# Patient Record
Sex: Male | Born: 2000 | Race: Black or African American | Hispanic: No | Marital: Single | Smoking: Never smoker
Health system: Southern US, Community
[De-identification: ages and names within clinical notes are randomized; demographics above are authoritative.]

## PROBLEM LIST (undated history)

## (undated) DIAGNOSIS — R0789 Other chest pain: Secondary | ICD-10-CM

---
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
## 2019-03-28 IMAGING — CR DG CHEST 2V
2 series · 2 of 2 positions shown · non-contrast
Comparison: None.

CLINICAL DATA: Intermittent cramping

EXAM:
CHEST  2 VIEW

[chest pa]
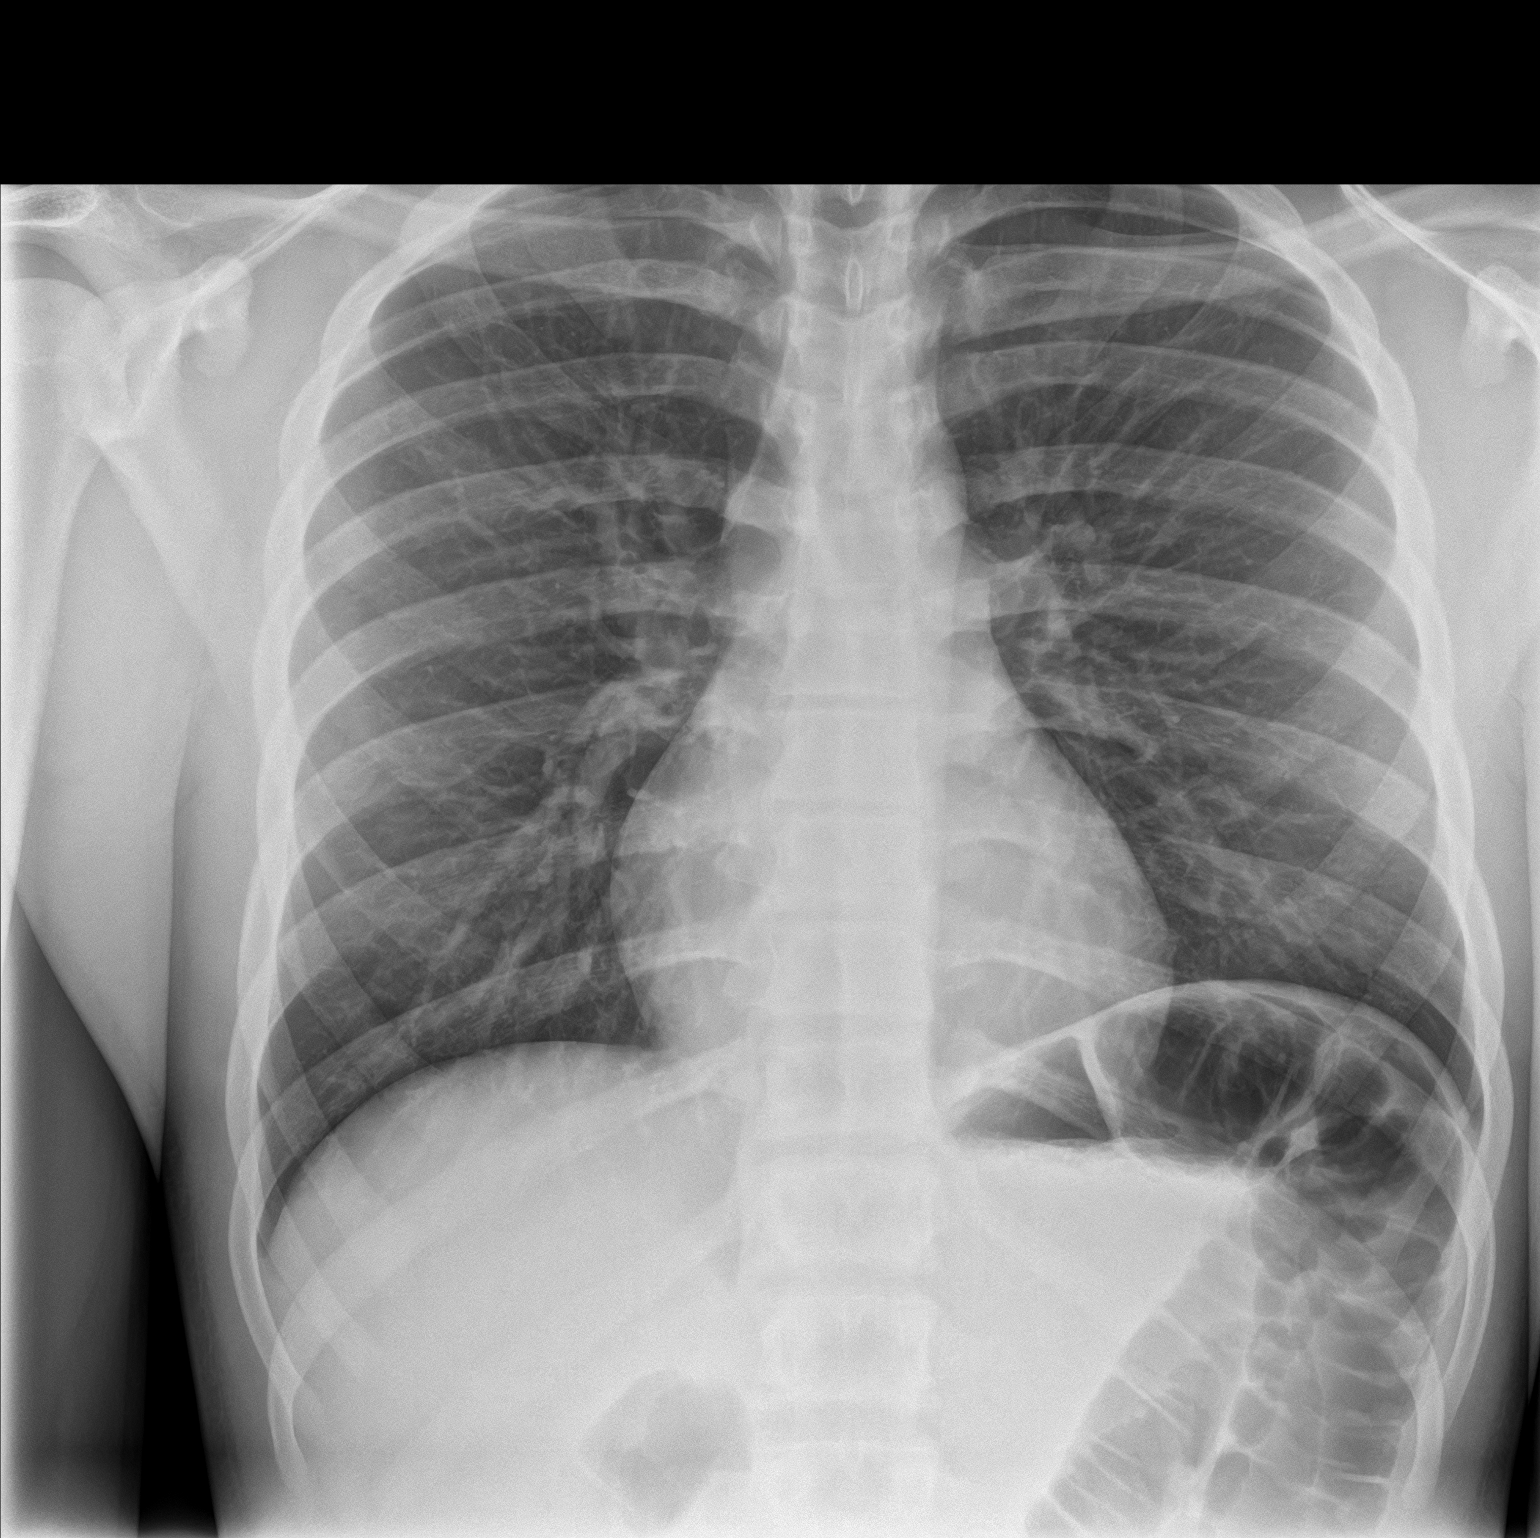

[chest lat]
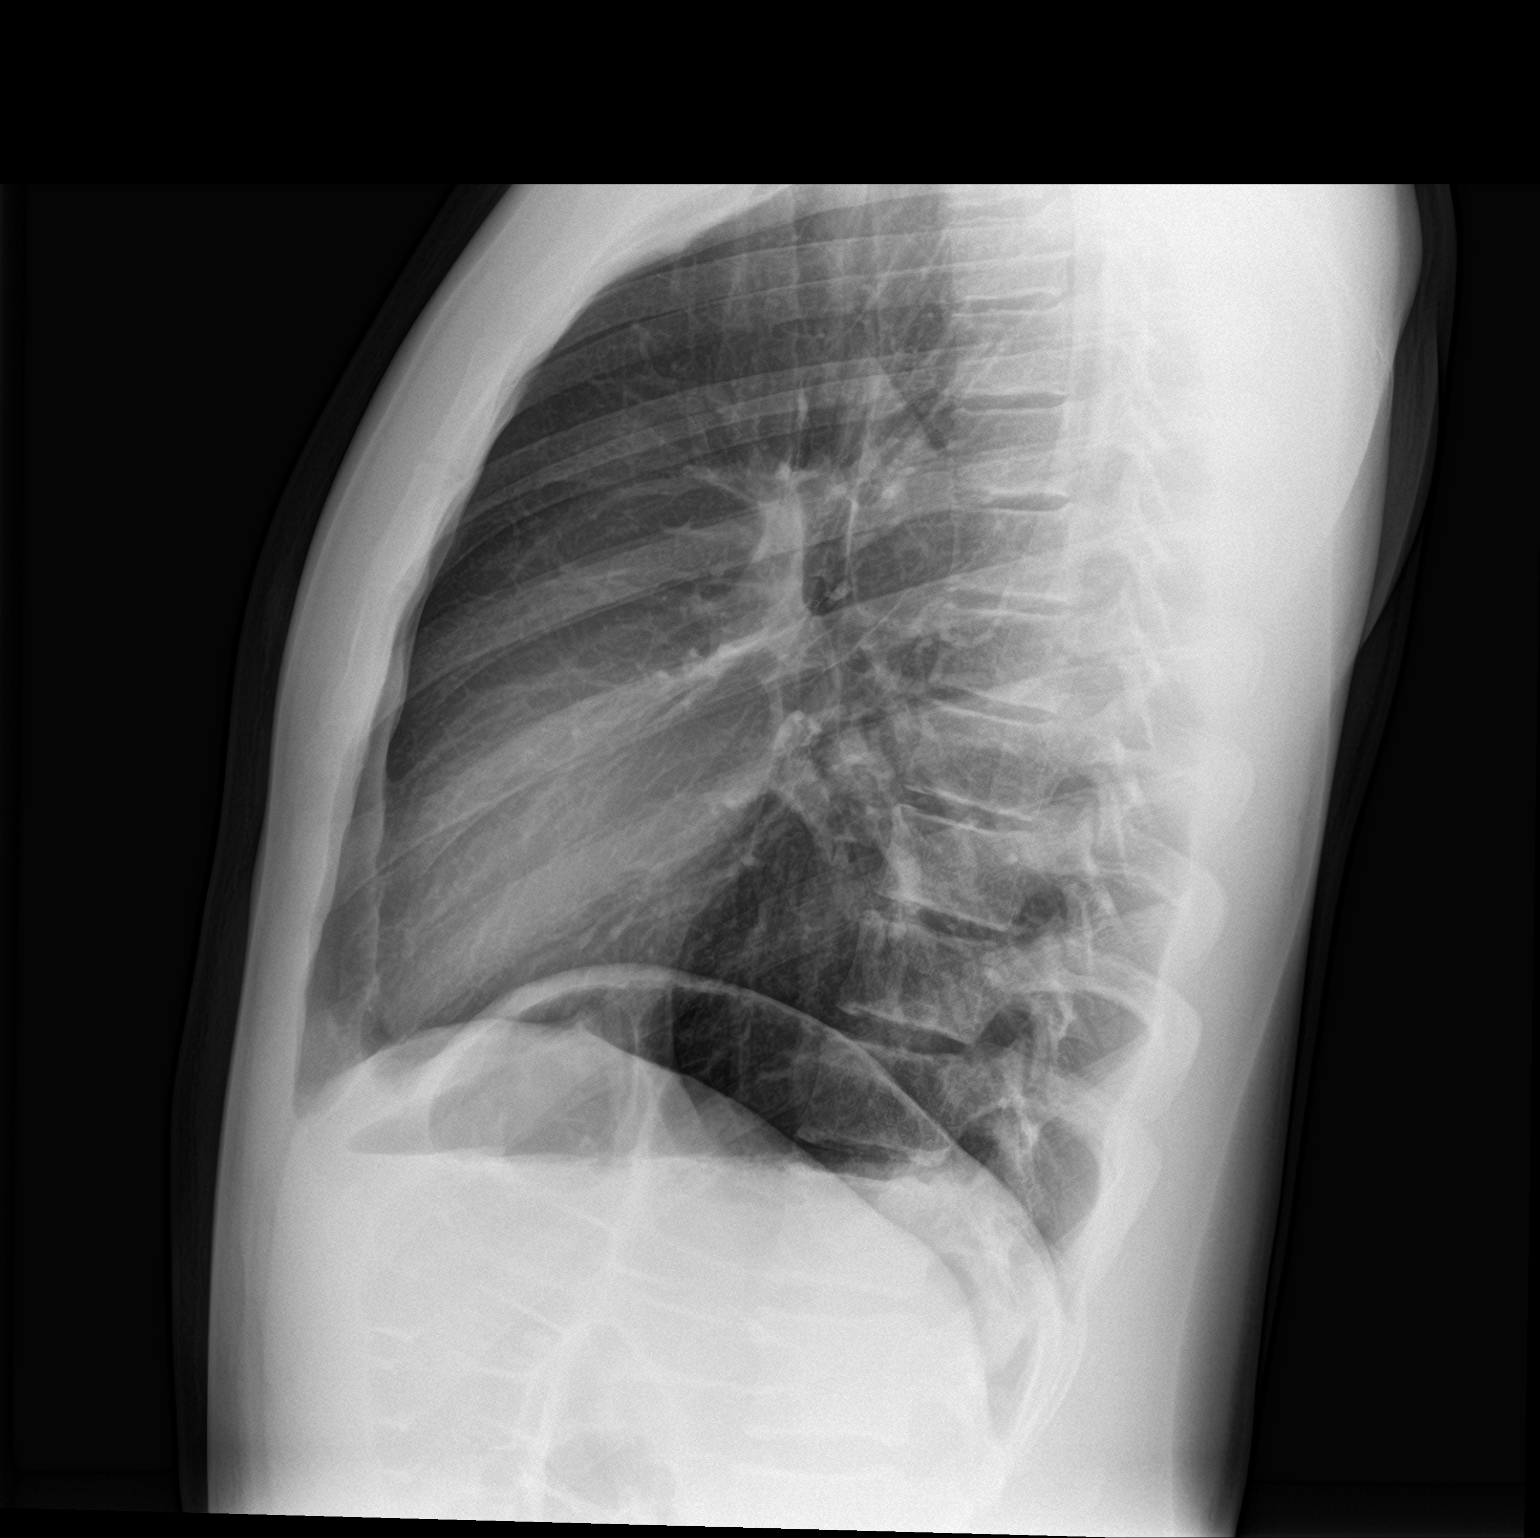

[2 of 2 positions shown; findings below may reference images not displayed]

FINDINGS: The heart size and mediastinal contours are within normal limits.
Both lungs are clear. The visualized skeletal structures are
unremarkable. Crescentic lucency beneath the left hemidiaphragm is
believed secondary to overlapping bowel.
IMPRESSION: No active cardiopulmonary disease.
